# Patient Record
Sex: Male | Born: 1990
Health system: Southern US, Community
[De-identification: ages and names within clinical notes are randomized; demographics above are authoritative.]

---
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
## 2010-08-01 ENCOUNTER — Emergency Department (HOSPITAL_COMMUNITY): Admission: EM | Admit: 2010-08-01 | Discharge: 2010-08-02 | Payer: Self-pay | Admitting: Emergency Medicine

## 2020-07-21 ENCOUNTER — Ambulatory Visit
Admission: EM | Admit: 2020-07-21 | Discharge: 2020-07-21 | Disposition: A | Discharge status: Home / Self Care | Payer: 59 | Attending: Emergency Medicine | Admitting: Emergency Medicine

## 2020-07-21 DIAGNOSIS — R21 Rash and other nonspecific skin eruption: Secondary | ICD-10-CM | POA: Diagnosis not present

## 2020-07-21 MED ORDER — TRIAMCINOLONE ACETONIDE 0.1 % EX CREA: 1.0000 "application " | TOPICAL_CREAM | Freq: Two times a day (BID) | CUTANEOUS | 0 refills | Status: AC

## 2020-07-21 MED ORDER — CETIRIZINE HCL 10 MG PO TABS: 10.0000 mg | ORAL_TABLET | Freq: Every day | ORAL | 0 refills | Status: AC

## 2020-07-21 MED ORDER — MUPIROCIN 2 % EX OINT: 1.0000 "application " | TOPICAL_OINTMENT | Freq: Two times a day (BID) | CUTANEOUS | 0 refills | Status: AC

## 2020-07-21 NOTE — ED Provider Notes (Signed)
[facility]   [id number] 07/21/20 Arrival Time: 0904   Chief Complaint  Patient presents with   Rash     SUBJECTIVE: History from: patient.  Paul Frye is a 29 y.o. male who presented to the urgent care with a complaint of rash to her left arm for the past few days.patient state he has completed course of amoxicillin.  Localizes the rash to left upper arm.described as red and itchy.  Esther OTC Benadryl without relief.  Denies aggravating factors.  Denies similar symptoms in the past.  Denies chills, fever, nausea, vomiting, diarrhea  ROS: As per HPI.  All other pertinent ROS negative.     History reviewed. No pertinent past medical history. History reviewed. No pertinent surgical history. Allergies  Allergen Reactions   Amoxicillin Rash   No current facility-administered medications on file prior to encounter.   No current outpatient medications on file prior to encounter.   Social History   Socioeconomic History   Marital status: Single    Spouse name: Not on file   Number of children: Not on file   Years of education: Not on file   Highest education level: Not on file  Occupational History   Not on file  Tobacco Use   Smoking status: Light Tobacco Smoker   Smokeless tobacco: Never Used  Substance and Sexual Activity   Alcohol use: Yes   Drug use: Not Currently   Sexual activity: Not on file  Other Topics Concern   Not on file  Social History Narrative   Not on file   Social Determinants of Health   Financial Resource Strain:    Difficulty of Paying Living Expenses: Not on file  Food Insecurity:    Worried About Running Out of Food in the Last Year: Not on file   Ran Out of Food in the Last Year: Not on file  Transportation Needs:    Lack of Transportation (Medical): Not on file   Lack of Transportation (Non-Medical): Not on file  Physical Activity:    Days of Exercise per Week: Not on file   Minutes of Exercise per  Session: Not on file  Stress:    Feeling of Stress : Not on file  Social Connections:    Frequency of Communication with Friends and Family: Not on file   Frequency of Social Gatherings with Friends and Family: Not on file   Attends Religious Services: Not on file   Active Member of Clubs or Organizations: Not on file   Attends Banker Meetings: Not on file   Marital Status: Not on file  Intimate Partner Violence:    Fear of Current or Ex-Partner: Not on file   Emotionally Abused: Not on file   Physically Abused: Not on file   Sexually Abused: Not on file   History reviewed. No pertinent family history.  OBJECTIVE:  Vitals:   07/21/20 0934  BP: (!) 148/100  Pulse: 85  Resp: 20  Temp: 98 F (36.7 C)  SpO2: 98%     Physical Exam Vitals and nursing note reviewed.  Constitutional:      General: He is not in acute distress.    Appearance: Normal appearance. He is normal weight. He is not ill-appearing, toxic-appearing or diaphoretic.  Cardiovascular:     Rate and Rhythm: Normal rate and regular rhythm.     Pulses: Normal pulses.     Heart sounds: Normal heart sounds. No murmur heard.  No friction rub. No gallop.  Pulmonary:     Effort: Pulmonary effort is normal. No respiratory distress.     Breath sounds: Normal breath sounds. No stridor. No wheezing, rhonchi or rales.  Chest:     Chest wall: No tenderness.  Skin:    Findings: Rash present. Rash is macular and pustular.     Comments: Macular-pustular rash present on left upper arm  Neurological:     Mental Status: He is alert and oriented to person, place, and time.     LABS:  No results found for this or any previous visit (from the past 24 hour(s)).   ASSESSMENT & PLAN:  1. Skin rash     Meds ordered this encounter  Medications   cetirizine (ZYRTEC ALLERGY) 10 MG tablet    Sig: Take 1 tablet (10 mg total) by mouth daily.    Dispense:  30 tablet    Refill:  0   triamcinolone  cream (KENALOG) 0.1 %    Sig: Apply 1 application topically 2 (two) times daily.    Dispense:  30 g    Refill:  0   mupirocin ointment (BACTROBAN) 2 %    Sig: Apply 1 application topically 2 (two) times daily.    Dispense:  22 g    Refill:  0    Discharge instructions Prescribed zyrtec and triamcinolone cream Apply thin layer of Bactroban to open area Take as prescribed and to completion Limit hot shower and baths, or bathe with warm water.   Moisturize skin daily Follow up with PCP if symptoms persists Return or go to the ER if you have any new or worsening symptoms  Reviewed expectations re: course of current medical issues. Questions answered. Outlined signs and symptoms indicating need for more acute intervention. Patient verbalized understanding. After Visit Summary given.      Note: This document was prepared using Dragon voice recognition software and may include unintentional dictation errors.    Durward Parcel, FNP 07/21/20 [phone]

## 2020-07-21 NOTE — ED Triage Notes (Signed)
Pt has rash on left arm that developed after taking amoxicillin last week

## 2020-07-21 NOTE — Discharge Instructions (Addendum)
Prescribed zyrtec and triamcinolone cream Take as prescribed and to completion Limit hot shower and baths, or bathe with warm water.   Moisturize skin daily Follow up with PCP if symptoms persists Return or go to the ER if you have any new or worsening symptoms
